# Patient Record
Sex: Male | Born: 1995 | Race: White | Hispanic: No | Marital: Single
Health system: Midwestern US, Community
[De-identification: ages and names within clinical notes are randomized; demographics above are authoritative.]

---

## 2018-06-02 ENCOUNTER — Ambulatory Visit: Attending: Family

## 2018-06-02 ENCOUNTER — Ambulatory Visit: Admit: 2018-06-02 | Discharge: 2018-06-02 | Payer: BLUE CROSS/BLUE SHIELD | Attending: Family

## 2018-06-02 DIAGNOSIS — Z7251 High risk heterosexual behavior: Secondary | ICD-10-CM

## 2018-06-02 LAB — AMB POC URINALYSIS DIP STICK AUTO W/ MICRO
Bilirubin (UA POC): NEGATIVE
Bilirubin, Urine, POC: NEGATIVE
Blood (UA POC): NEGATIVE
Blood (UA POC): NEGATIVE
Glucose (UA POC): NEGATIVE
Glucose, Urine, POC: NEGATIVE
Ketones (UA POC): NEGATIVE
Ketones, Urine, POC: NEGATIVE
Leukocyte Esterase, Urine, POC: NEGATIVE
Leukocyte esterase (UA POC): NEGATIVE
Nitrite, Urine, POC: NEGATIVE
Nitrites (UA POC): NEGATIVE
Protein (UA POC): NEGATIVE
Protein, Urine, POC: NEGATIVE
Specific Gravity, Urine, POC: 1.025 NA (ref 1.001–1.035)
Specific gravity (UA POC): 1.025 (ref 1.001–1.035)
Urobilinogen (UA POC): 0.2 (ref 0.2–1)
Urobilinogen, POC: 0.2 (ref 0.2–1)
pH (UA POC): 5.5 (ref 4.6–8.0)
pH, Urine, POC: 5.5 NA (ref 4.6–8.0)

## 2018-06-02 NOTE — Addendum Note (Signed)
Addended by: Ricki RodriguezBOUDREAU, Kechia Yahnke on: 06/05/2018 02:07 PM     Modules accepted: Orders

## 2018-06-02 NOTE — Progress Notes (Signed)
HISTORY OF PRESENT ILLNESS  Joshua Frank is a 22 y.o. male.  Exposure to STD   The history is provided by the patient. This is a new problem. The current episode started more than 2 days ago (1 week GO). The problem occurs rarely. The problem has not changed since onset.Pertinent negatives include no chest pain, no abdominal pain, no headaches and no shortness of breath. Nothing aggravates the symptoms. Nothing relieves the symptoms. He has tried nothing for the symptoms.       Review of Systems   Constitutional: Negative.    HENT: Negative.    Eyes: Negative.    Respiratory: Negative.  Negative for shortness of breath.    Cardiovascular: Negative.  Negative for chest pain.   Gastrointestinal: Negative.  Negative for abdominal pain.   Genitourinary: Negative.    Musculoskeletal: Negative.    Skin: Negative.    Neurological: Negative.  Negative for headaches.   Endo/Heme/Allergies: Negative.    Psychiatric/Behavioral: Negative.        Physical Exam   Constitutional: He is oriented to person, place, and time. Vital signs are normal. He appears well-developed and well-nourished. He is cooperative. No distress.   HENT:   Head: Normocephalic and atraumatic.   Right Ear: Hearing and external ear normal.   Left Ear: Hearing and external ear normal.   Nose: Nose normal.   Mouth/Throat: Uvula is midline and oropharynx is clear and moist. No oropharyngeal exudate, posterior oropharyngeal edema, posterior oropharyngeal erythema or tonsillar abscesses.   Eyes: Pupils are equal, round, and reactive to light. Conjunctivae and lids are normal.   Neck: Normal range of motion.   Cardiovascular: Normal rate, regular rhythm, S1 normal, S2 normal and normal heart sounds.   No murmur heard.  Pulmonary/Chest: Effort normal and breath sounds normal. No respiratory distress. He has no decreased breath sounds. He has no wheezes. He has no rhonchi. He has no rales.   Abdominal: Soft. Normal appearance and bowel sounds are normal. He  exhibits no distension. There is no hepatosplenomegaly. There is no tenderness. There is no rebound and no CVA tenderness.   Genitourinary: Penis normal. No penile tenderness.   Musculoskeletal: Normal range of motion.   Lymphadenopathy:     He has no cervical adenopathy.        Right: No inguinal adenopathy present.        Left: No inguinal adenopathy present.   Neurological: He is alert and oriented to person, place, and time. He has normal strength.   Skin: Skin is warm, dry and intact. No rash noted. He is not diaphoretic. No erythema. No pallor.   Psychiatric: He has a normal mood and affect. His speech is normal and behavior is normal. Judgment and thought content normal. Cognition and memory are normal.   Nursing note and vitals reviewed.      ASSESSMENT and PLAN  Patient presents for STD panel. He had episodes unprotected sex few months ago and last week started to feel little discomfort while urinating, he denies fever, no chills , no increase frequency, no blood, no mucus, no penile discharge, no abdominal pain, only a faint discomfort sensation genital area, no redness, no vesicles  Urine dipstick: all neg  Will do HIV, syphilis, Gn, ch and trich in urine  Will treat according to result  Discuss sex protection    ICD-10-CM ICD-9-CM    1. Unprotected sexual intercourse Z72.51 V69.2 RPR W/REFLEX TITER AND TREPONEMA ABS      HIV  1/2 AG/AB, 4TH GENERATION,W RFLX CONFIRM      CHLAMYDIA/GC PCR      TRICHOMONAS CULTURE      AMB POC URINALYSIS DIP STICK AUTO W/ MICRO   2. Dysuria R30.0 788.1      Visit Vitals  BP 129/71 (BP 1 Location: Left arm, BP Patient Position: Sitting)   Pulse 63   Temp 98.6 ??F (37 ??C) (Oral)   Resp 17   Ht 6\' 3"  (1.905 m)   Wt 230 lb (104.3 kg)   SpO2 97%   BMI 28.75 kg/m??           *Side effects, adverse effects, risks versus benefits associated with medications prescribed/recommended were discussed with the patient. Patient verbalized understanding. All questions answered.       * I have reviewed the patient's medication list, past medical, family, social, and surgical    history and updated the patient record appropriately      Social History     Socioeconomic History   ??? Marital status: SINGLE     Spouse name: Not on file   ??? Number of children: Not on file   ??? Years of education: Not on file   ??? Highest education level: Not on file   Occupational History   ??? Not on file   Social Needs   ??? Financial resource strain: Not on file   ??? Food insecurity:     Worry: Not on file     Inability: Not on file   ??? Transportation needs:     Medical: Not on file     Non-medical: Not on file   Tobacco Use   ??? Smoking status: Never Smoker   ??? Smokeless tobacco: Never Used   Substance and Sexual Activity   ??? Alcohol use: Not on file   ??? Drug use: Not on file   ??? Sexual activity: Not on file   Lifestyle   ??? Physical activity:     Days per week: Not on file     Minutes per session: Not on file   ??? Stress: Not on file   Relationships   ??? Social connections:     Talks on phone: Not on file     Gets together: Not on file     Attends religious service: Not on file     Active member of club or organization: Not on file     Attends meetings of clubs or organizations: Not on file     Relationship status: Not on file   ??? Intimate partner violence:     Fear of current or ex partner: Not on file     Emotionally abused: Not on file     Physically abused: Not on file     Forced sexual activity: Not on file   Other Topics Concern   ??? Not on file   Social History Narrative   ??? Not on file     History reviewed. No pertinent past medical history.  History reviewed. No pertinent surgical history.  History reviewed. No pertinent family history.

## 2018-06-02 NOTE — Patient Instructions (Signed)
Safer Sex: Care Instructions  Your Care Instructions  Safer sex is a way to reduce your risk of getting an infection spread through sex. It can also help prevent pregnancy. Most infections that are spread through sex, also called sexually transmitted infections or STIs, can be cured. But some can decrease your chances of getting pregnant if they are not treated early. Others, such as herpes, have no cure. And some, such as HIV, can be deadly.  Several products can help you practice safer sex and reduce your chance of STIs. One of the best is a condom. There are condoms for men and for women. The male condom is a tube of soft plastic with a closed end that is placed deep into the vagina. You can use a special rubber sheet (dental dam) for protection during oral sex. Disposable gloves can keep your hands from touching blood, semen, or other body fluids that can carry infections.  Remember that birth control methods such as diaphragms, IUDs, foams, and birth control pills do not stop you from getting STIs.  Follow-up care is a key part of your treatment and safety. Be sure to make and go to all appointments, and call your doctor if you are having problems. It's also a good idea to know your test results and keep a list of the medicines you take.  How can you care for yourself at home?  ?? Think about getting shots to prevent hepatitis A and hepatitis B. These two diseases can be spread through sex. You also can get hepatitis A if you eat infected food.  ?? Use condoms or male condoms each time and every time you have sex.  ?? Learn the right way to use a male condom:  ? Condoms come in several sizes. Make sure you use the right size. A condom that is too small can break easily. A condom that is too big can slip off during sex. Use a new condom each time you have sex.  ? Be careful not to poke a hole in the condom when you open the wrapper.  ? Squeeze the tip of the condom to keep out air.   ? Pull down the loose skin (foreskin) from the head of an uncircumcised penis.  ? While squeezing the tip of the condom, unroll it all the way down to the base of the firm penis.  ? Never use petroleum jelly (such as Vaseline), grease, hand lotion, baby oil, or anything with oil in it. These products can make holes in the condom.  ? After sex, hold the condom on your penis as you remove your penis from your partner. This will keep semen from spilling out of the condom.  ?? Learn to use a male condom:  ? You can put in a male condom up to 8 hours before sex.  ? Squeeze the smaller ring at the closed end and insert it deep into the vagina. The larger ring at the open end should stay outside the vagina.  ? During sex, make sure the penis goes into the condom.  ? After the penis is removed, close the open end of the condom by twisting it. Remove the condom.  ?? Do not use a male condom and male condom at the same time.  ?? Do not have sex with anyone who has symptoms of an STI, such as sores on the genitals or mouth. The herpes virus that causes cold sores can spread to and from the penis and   vagina.  ?? Do not drink a lot of alcohol or use drugs before sex. This can cause you to let down your guard and not practice safer sex.  ?? Having one sex partner (who does not have STIs and does not have sex with anyone else) is a sure way to avoid STIs.  ?? Talk to your partner before you have sex. Find out if he or she has or is at risk for any STI. Keep in mind that a person may be able to spread an STI even if he or she does not have symptoms. You and your partner may want to get an HIV test. You should get tested again 6 months later.  Where can you learn more?  Go to http://www.healthwise.net/[employer].  Enter B608 in the search box to learn more about "Safer Sex: Care Instructions."  Current as of: May 06, 2017  Content Version: 12.2  ?? 2006-2019 [employer]. Care instructions adapted under  license by [employer] (which disclaims liability or warranty for this information). If you have questions about a medical condition or this instruction, always ask your healthcare professional. [employer] disclaims any warranty or liability for your use of this information.

## 2018-06-02 NOTE — Addendum Note (Signed)
Addendum Note by Ricki Rodriguez, NP at 06/02/18 1100                Author: Ricki Rodriguez, NP  Service: --  Author Type: Nurse Practitioner       Filed: 06/05/18 1407  Encounter Date: 06/02/2018  Status: Signed          Editor: Ricki Rodriguez, NP (Nurse Practitioner)          Addended by: Ricki Rodriguez on: 06/05/2018 02:07 PM    Modules accepted: Orders

## 2018-06-02 NOTE — Progress Notes (Signed)
HISTORY OF PRESENT ILLNESS  Joshua Frank is a 22 y.o. male.  Exposure to STD   The history is provided by the patient. This is a new problem. The current episode started more than 2 days ago (1 week GO). The problem occurs rarely. The problem has not changed since onset.Pertinent negatives include no chest pain, no abdominal pain, no headaches and no shortness of breath. Nothing aggravates the symptoms. Nothing relieves the symptoms. He has tried nothing for the symptoms.       Review of Systems   Constitutional: Negative.    HENT: Negative.    Eyes: Negative.    Respiratory: Negative.  Negative for shortness of breath.    Cardiovascular: Negative.  Negative for chest pain.   Gastrointestinal: Negative.  Negative for abdominal pain.   Genitourinary: Negative.    Musculoskeletal: Negative.    Skin: Negative.    Neurological: Negative.  Negative for headaches.   Endo/Heme/Allergies: Negative.    Psychiatric/Behavioral: Negative.        Physical Exam   Constitutional: He is oriented to person, place, and time. Vital signs are normal. He appears well-developed and well-nourished. He is cooperative. No distress.   HENT:   Head: Normocephalic and atraumatic.   Right Ear: Hearing and external ear normal.   Left Ear: Hearing and external ear normal.   Nose: Nose normal.   Mouth/Throat: Uvula is midline and oropharynx is clear and moist. No oropharyngeal exudate, posterior oropharyngeal edema, posterior oropharyngeal erythema or tonsillar abscesses.   Eyes: Pupils are equal, round, and reactive to light. Conjunctivae and lids are normal.   Neck: Normal range of motion.   Cardiovascular: Normal rate, regular rhythm, S1 normal, S2 normal and normal heart sounds.   No murmur heard.  Pulmonary/Chest: Effort normal and breath sounds normal. No respiratory distress. He has no decreased breath sounds. He has no wheezes. He has no rhonchi. He has no rales.   Abdominal: Soft. Normal appearance and bowel sounds are normal. He  exhibits no distension. There is no hepatosplenomegaly. There is no tenderness. There is no rebound and no CVA tenderness.   Genitourinary: Penis normal. No penile tenderness.   Musculoskeletal: Normal range of motion.   Lymphadenopathy:     He has no cervical adenopathy.        Right: No inguinal adenopathy present.        Left: No inguinal adenopathy present.   Neurological: He is alert and oriented to person, place, and time. He has normal strength.   Skin: Skin is warm, dry and intact. No rash noted. He is not diaphoretic. No erythema. No pallor.   Psychiatric: He has a normal mood and affect. His speech is normal and behavior is normal. Judgment and thought content normal. Cognition and memory are normal.   Nursing note and vitals reviewed.      ASSESSMENT and PLAN  Patient presents for STD panel. He had episodes unprotected sex few months ago and last week started to feel little discomfort while urinating, he denies fever, no chills , no increase frequency, no blood, no mucus, no penile discharge, no abdominal pain, only a faint discomfort sensation genital area, no redness, no vesicles  Urine dipstick: all neg  Will do HIV, syphilis, Gn, ch and trich in urine  Will treat according to result  Discuss sex protection    ICD-10-CM ICD-9-CM    1. Unprotected sexual intercourse Z72.51 V69.2 RPR W/REFLEX TITER AND TREPONEMA ABS      HIV  1/2 AG/AB, 4TH GENERATION,W RFLX CONFIRM      CHLAMYDIA/GC PCR      TRICHOMONAS CULTURE      AMB POC URINALYSIS DIP STICK AUTO W/ MICRO   2. Dysuria R30.0 788.1      Visit Vitals  BP 129/71 (BP 1 Location: Left arm, BP Patient Position: Sitting)   Pulse 63   Temp 98.6 ??F (37 ??C) (Oral)   Resp 17   Ht 6\' 3"  (1.905 m)   Wt 230 lb (104.3 kg)   SpO2 97%   BMI 28.75 kg/m??           *Side effects, adverse effects, risks versus benefits associated with medications prescribed/recommended were discussed with the patient. Patient verbalized understanding. All questions answered.      * I have  reviewed the patient's medication list, past medical, family, social, and surgical    history and updated the patient record appropriately      Social History     Socioeconomic History   ??? Marital status: SINGLE     Spouse name: Not on file   ??? Number of children: Not on file   ??? Years of education: Not on file   ??? Highest education level: Not on file   Occupational History   ??? Not on file   Social Needs   ??? Financial resource strain: Not on file   ??? Food insecurity:     Worry: Not on file     Inability: Not on file   ??? Transportation needs:     Medical: Not on file     Non-medical: Not on file   Tobacco Use   ??? Smoking status: Never Smoker   ??? Smokeless tobacco: Never Used   Substance and Sexual Activity   ??? Alcohol use: Not on file   ??? Drug use: Not on file   ??? Sexual activity: Not on file   Lifestyle   ??? Physical activity:     Days per week: Not on file     Minutes per session: Not on file   ??? Stress: Not on file   Relationships   ??? Social connections:     Talks on phone: Not on file     Gets together: Not on file     Attends religious service: Not on file     Active member of club or organization: Not on file     Attends meetings of clubs or organizations: Not on file     Relationship status: Not on file   ??? Intimate partner violence:     Fear of current or ex partner: Not on file     Emotionally abused: Not on file     Physically abused: Not on file     Forced sexual activity: Not on file   Other Topics Concern   ??? Not on file   Social History Narrative   ??? Not on file     History reviewed. No pertinent past medical history.  History reviewed. No pertinent surgical history.  History reviewed. No pertinent family history.

## 2018-06-03 LAB — CHLAMYDIA/GC PCR
CHLAMYDIA TRACHOMATIS, NAA, 188078: POSITIVE — AB
Chlamydia trachomatis, NAA: POSITIVE — AB
NEISSERIA GONORRHOEAE, NAA, 188086: NEGATIVE
Neisseria gonorrhoeae, NAA: NEGATIVE

## 2018-06-04 LAB — RPR W/REFLEX TITER AND TREPONEMA ABS
RPR: NONREACTIVE
RPR: NONREACTIVE

## 2018-06-04 LAB — HIV 1/2 AG/AB, 4TH GENERATION,W RFLX CONFIRM: HIV SCREEN 4TH GENERATION WRFX: NONREACTIVE

## 2018-06-04 LAB — TRICHOMONAS CULTURE

## 2018-06-04 LAB — HIV 1/2 ANTIGEN/ANTIBODY, FOURTH GENERATION W/RFL: HIV Screen 4th Generation wRfx: NONREACTIVE

## 2018-06-05 MED ORDER — AZITHROMYCIN 500 MG TAB
500 mg | ORAL_TABLET | ORAL | 0 refills | Status: AC
Start: 2018-06-05 — End: ?

## 2018-07-04 NOTE — Telephone Encounter (Signed)
Discussed results of STD positive for chlamydia, discussed the disease and why import to get it treated will treat with azithromycin 1g today  Come back in 6 weeks for repeat test or sonner if symptoms does not improve  Tell partner will need to get tested and treated  Pt agrees and verbalize understanding

## 2020-03-27 ENCOUNTER — Ambulatory Visit (HOSPITAL_COMMUNITY)
Admission: RE | Admit: 2020-03-27 | Discharge: 2020-03-27 | Disposition: A | Discharge status: Home / Self Care | Payer: 59 | Attending: Psychiatry | Admitting: Psychiatry

## 2020-03-27 NOTE — H&P (Addendum)
Behavioral Health Medical Screening Exam  Jose Conley is an 24 y.o. male.who presented to [facility] as a walk-in, voluntarily. Patient endorsed depression and anxiety. He stated that he was advised to come to [facility] by friends who had noticed his change in mood. He stated," a lot of things have been building up lately that has effected my mental health." He stated," last Friday I was so anxious after having a issue with my jaw that I became paranoid."  He denied current suicidal and homicidal thoughts. He denied current psychosis. He reported he has had off and on suicidal thoughts for the past 3 months although denied recently that he has had plan or intent to harm himself. He denied prior suicide attempts but did state in October of last year, following a fall ut with his girlfriend, he held a knife and had thoughts of cutting himself. He denied history of NSSIB. Denied prior psychiatric history. Denied having any outpatient psychiatric services. He reported daily use of mariajuana and occasional acholia use. He denied other substance abuse or use. Denied access to firearms. Denied legal issues. Denied history of aggression or assaultive  behaviors.  Denied trauma related history. Stated he was hoping to get resources for intensive outpatient psychiatric services.   Total Time spent with patient: 30 minutes  Psychiatric Specialty Exam: Physical Exam Psychiatric:        Behavior: Behavior normal.        Thought Content: Thought content normal.        Judgment: Judgment normal.     Comments: Anxious and depressed mood     Review of Systems  Psychiatric/Behavioral: The patient is nervous/anxious.    There were no vitals taken for this visit.There is no height or weight on file to calculate BMI. General Appearance: Fairly Groomed Eye Contact:  Fair Speech:  Clear and Coherent and Normal Rate Volume:  Normal Mood:  Anxious and Depressed Affect:  Congruent Thought Process:  Coherent, Linear and  Descriptions of Associations: Intact Orientation:  Full (Time, Place, and Person) Thought Content:  Logical Suicidal Thoughts:  No Homicidal Thoughts:  No Memory:  Immediate;   Fair Recent;   Fair Judgement:  Fair Insight:  Fair Psychomotor Activity:  Normal Concentration: Concentration: Fair and Attention Span: Fair Recall:  [employer] of Knowledge:Fair Language: Good Akathisia:  Negative Handed:  Right AIMS (if indicated):    Assets:  Communication Skills Desire for Improvement Resilience Sleep:     Musculoskeletal: Strength & Muscle Tone: within normal limits Gait & Station: normal Patient leans: N/A  There were no vitals taken for this visit.  Recommendations: Based on my evaluation the patient does not appear to have an emergency medical condition.   No evidence of imminent risk to self or others at present.   Patient does not meet criteria for psychiatric inpatient admission. Resources provided for outpatient therapy. Patient encouraged to follow-up with resources.  Patient determined to be mentally safe and stable to leave the hospital.      Denzil Magnuson, NP 03/27/2020, 1:46 PM

## 2021-04-16 ENCOUNTER — Encounter (HOSPITAL_BASED_OUTPATIENT_CLINIC_OR_DEPARTMENT_OTHER): Payer: Self-pay

## 2021-04-16 ENCOUNTER — Emergency Department (HOSPITAL_BASED_OUTPATIENT_CLINIC_OR_DEPARTMENT_OTHER)
Admission: EM | Admit: 2021-04-16 | Discharge: 2021-04-17 | Disposition: A | Discharge status: Home / Self Care | Payer: 59 | Attending: Emergency Medicine | Admitting: Emergency Medicine

## 2021-04-16 ENCOUNTER — Emergency Department (HOSPITAL_BASED_OUTPATIENT_CLINIC_OR_DEPARTMENT_OTHER): Payer: 59

## 2021-04-16 ENCOUNTER — Other Ambulatory Visit: Payer: Self-pay

## 2021-04-16 DIAGNOSIS — F17210 Nicotine dependence, cigarettes, uncomplicated: Secondary | ICD-10-CM | POA: Diagnosis not present

## 2021-04-16 DIAGNOSIS — S8011XA Contusion of right lower leg, initial encounter: Secondary | ICD-10-CM | POA: Diagnosis not present

## 2021-04-16 DIAGNOSIS — Y9389 Activity, other specified: Secondary | ICD-10-CM | POA: Insufficient documentation

## 2021-04-16 DIAGNOSIS — W1849XA Other slipping, tripping and stumbling without falling, initial encounter: Secondary | ICD-10-CM | POA: Diagnosis not present

## 2021-04-16 DIAGNOSIS — S8991XA Unspecified injury of right lower leg, initial encounter: Secondary | ICD-10-CM | POA: Diagnosis present

## 2021-04-16 MED ORDER — OXYCODONE-ACETAMINOPHEN 5-325 MG PO TABS
1.0000 | ORAL_TABLET | ORAL | Status: DC | PRN
  Administered 2021-04-16: 1 via ORAL
  Filled 2021-04-16: qty 1

## 2021-04-16 NOTE — ED Triage Notes (Signed)
Was playing kickball and slid into base and now has right knee pain and swelling.

## 2021-04-17 NOTE — Discharge Instructions (Addendum)
Use crutches for at least a week.  Do not put any weight on the right leg.  Keep it elevated above your heart.  You can place ice on the injured area frequently. If you have any new weakness, numbness or discoloration of your right foot in the next 48 to 72 hours please return to the ER

## 2021-04-17 NOTE — ED Provider Notes (Signed)
[facility] EMERGENCY DEPT Provider Note   CSN: [id number] Arrival date & time: 04/16/21  2036     History Chief Complaint  Patient presents with   Leg Injury    Jose Conley is a 25 y.o. male.  The history is provided by the patient.  Leg Pain Location:  Leg Injury: yes   Leg location:  R lower leg Pain details:    Quality:  Aching   Radiates to:  Does not radiate   Severity:  Severe   Onset quality:  Sudden   Duration:  4 hours   Timing:  Constant   Progression:  Improving Chronicity:  New Relieved by:  Immobilization Worsened by:  Activity Associated symptoms: swelling   Associated symptoms: no muscle weakness   Patient presents for right leg injury.  Patient was playing kickball and slid into a base and injured his right leg.  No other injuries reported.  He reports it hurts to walk.     PMH-none Social History   Tobacco Use   Smoking status: Every Day    Types: Cigarettes   Smokeless tobacco: Never  Vaping Use   Vaping Use: Never used  Substance Use Topics   Alcohol use: Yes   Drug use: Never    Home Medications Prior to Admission medications   Not on File    Allergies    Patient has no known allergies.  Review of Systems   Review of Systems  Musculoskeletal:  Positive for arthralgias.  Neurological:  Negative for weakness.   Physical Exam Updated Vital Signs BP 136/88 (BP Location: Right Arm)   Pulse 71   Temp 98.3 F (36.8 C) (Oral)   Resp 15   Ht 1.905 m (6\' 3" )   Wt 108.4 kg   SpO2 100%   BMI 29.87 kg/m   Physical Exam CONSTITUTIONAL: Well developed/well nourished HEAD: Normocephalic/atraumatic EYES: EOMI ENMT: Mucous membranes moist NECK: supple no meningeal signs NEURO: Pt is awake/alert/appropriate, moves all extremitiesx4.  No facial droop.   EXTREMITIES: pulses normal/equal, full ROM Tenderness and abrasion noted to right proximal tibial surface with mild swelling.  No lacerations.  Patient is able to  flex and extend the right knee without difficulty.  There is no right ankle or foot tenderness.  Distal pulses are equal intact.  No calf tenderness noted SKIN: warm, color normal PSYCH: no abnormalities of mood noted, alert and oriented to situation  ED Results / Procedures / Treatments   Labs (all labs ordered are listed, but only abnormal results are displayed) Labs Reviewed - No data to display  EKG None  Radiology DG Tibia/Fibula Right  Result Date: 04/16/2021 CLINICAL DATA:  Injury while playing kickball. Pain on the anterior proximal tib/fib. Slight swelling. EXAM: RIGHT TIBIA AND FIBULA - 2 VIEW COMPARISON:  None. FINDINGS: No evidence of acute fracture or dislocation. No focal bone lesion or bone destruction. Prominent vascular structures in the soft tissues likely representing venous varicosities. Scattered soft tissue calcification may represent dystrophic calcification or possibly vascular calcification. IMPRESSION: No acute bony abnormalities. Electronically Signed   By: 04/18/2021 M.D.   On: 04/16/2021 21:26    Procedures Procedures   Medications Ordered in ED Medications  oxyCODONE-acetaminophen (PERCOCET/ROXICET) 5-325 MG per tablet 1 tablet (1 tablet Oral Given 04/16/21 2055)    ED Course  I have reviewed the triage vital signs and the nursing notes.  Pertinent  imaging results that were available during my care of the patient were reviewed by  me and considered in my medical decision making (see chart for details).    MDM Rules/Calculators/A&P                           I personally reviewed the x-ray there is no acute fracture.  Patient reports some improvement while waiting in the ER. Suspect contusion.  Plan to use crutches, elevate, ice frequently. If no improvement next week he needs to follow-up with orthopedics We discussed strict  ER return precautions Final Clinical Impression(s) / ED Diagnoses Final diagnoses:  Contusion of right lower  extremity, initial encounter    Rx / DC Orders ED Discharge Orders     None        Zadie Rhine, MD 04/17/21 [phone]

## 2021-09-28 ENCOUNTER — Other Ambulatory Visit: Payer: Self-pay | Admitting: Internal Medicine

## 2021-09-28 DIAGNOSIS — E069 Thyroiditis, unspecified: Secondary | ICD-10-CM

## 2021-10-16 ENCOUNTER — Ambulatory Visit
Admission: RE | Admit: 2021-10-16 | Discharge: 2021-10-16 | Disposition: A | Discharge status: Home / Self Care | Payer: 59 | Source: Ambulatory Visit | Attending: Internal Medicine | Admitting: Internal Medicine

## 2021-10-16 DIAGNOSIS — E069 Thyroiditis, unspecified: Secondary | ICD-10-CM

## 2022-12-12 IMAGING — US US THYROID
1 series · 14 of 25 positions shown · non-contrast
Comparison: None.

CLINICAL DATA: Goiter. Thyroiditis, possible thyroid gland
calcifications seen on outside imaging.

EXAM:
THYROID ULTRASOUND
TECHNIQUE: Ultrasound examination of the thyroid gland and adjacent soft
tissues was performed.

[Series 1: us thyroid · 0.07mm/px · 14 of 35 slices shown]
[im 1/35]
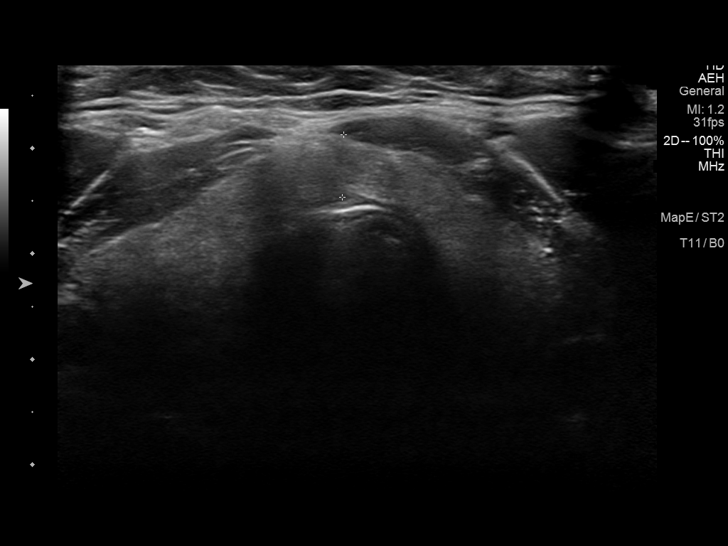
[im 3/35]
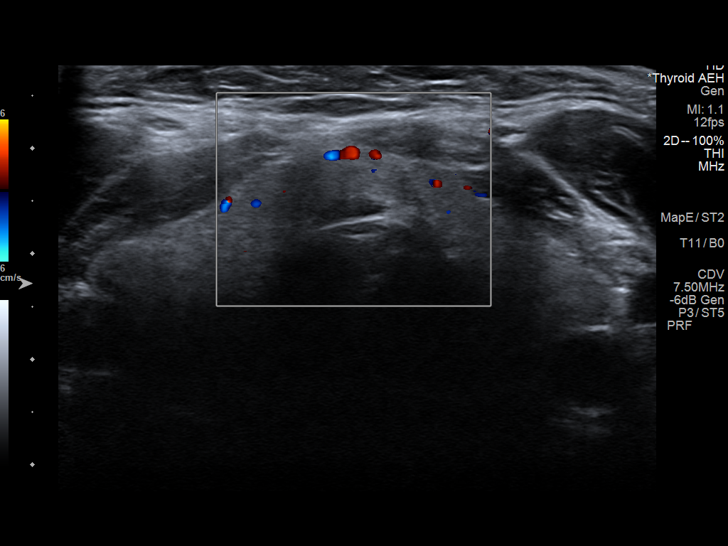
[im 6/35]
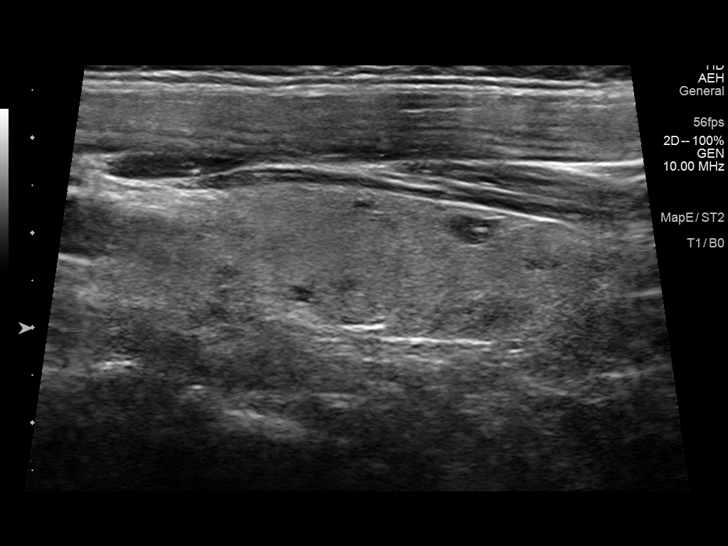
[im 9/35]
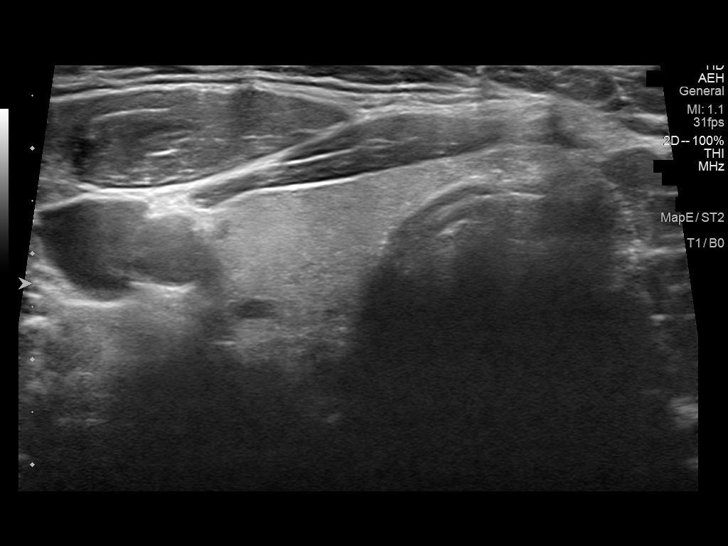
[im 12/35]
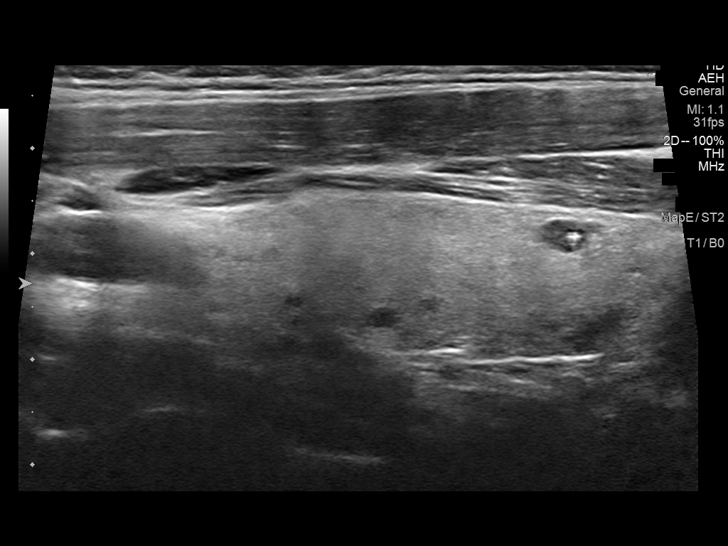
[im 13/35]
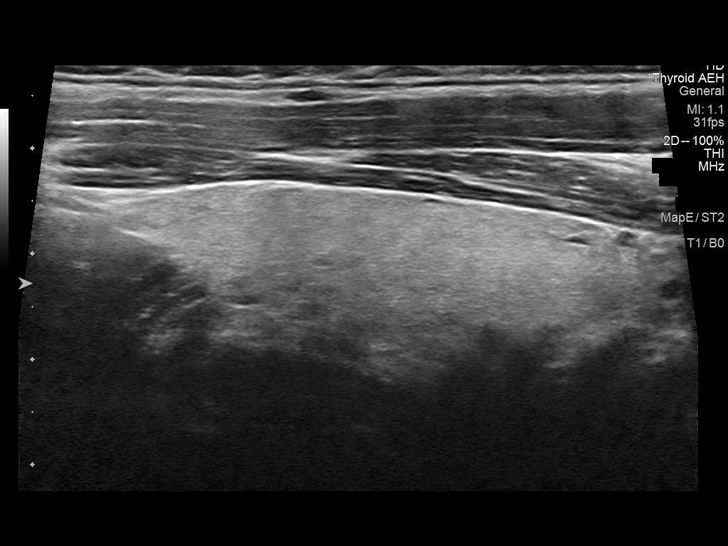
[im 16/35]
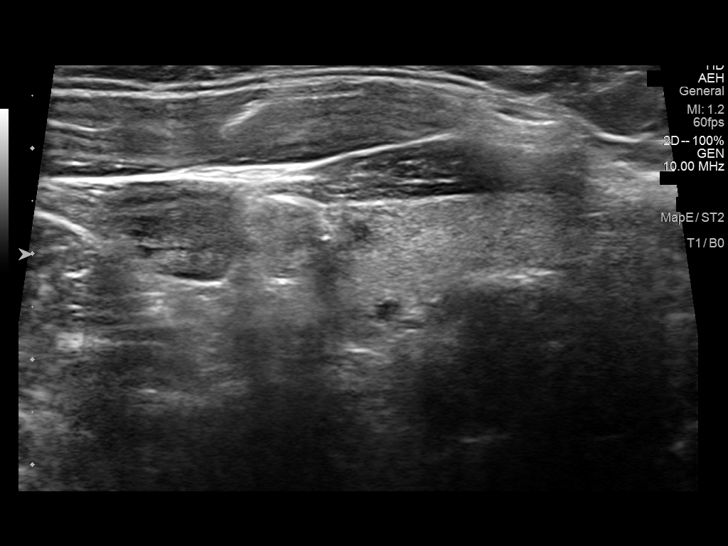
[im 19/35]
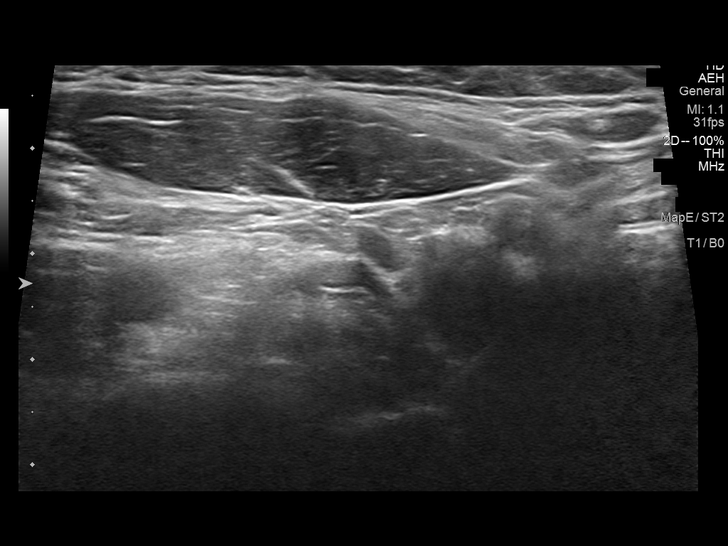
[im 22/35]
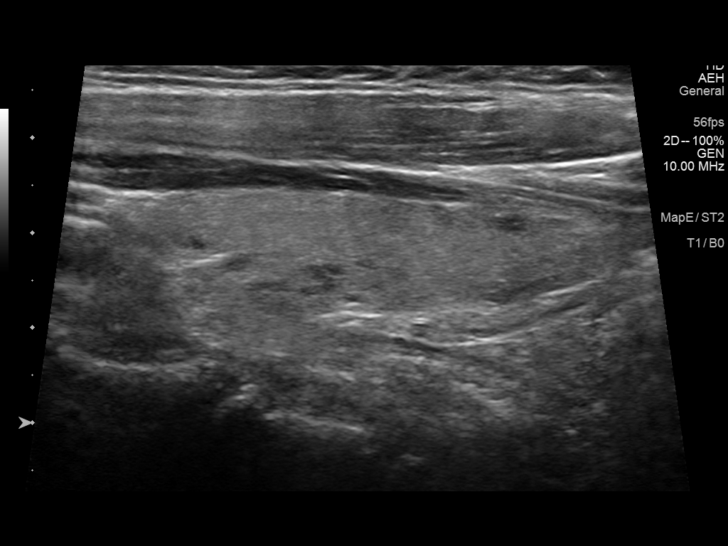
[im 23/35]
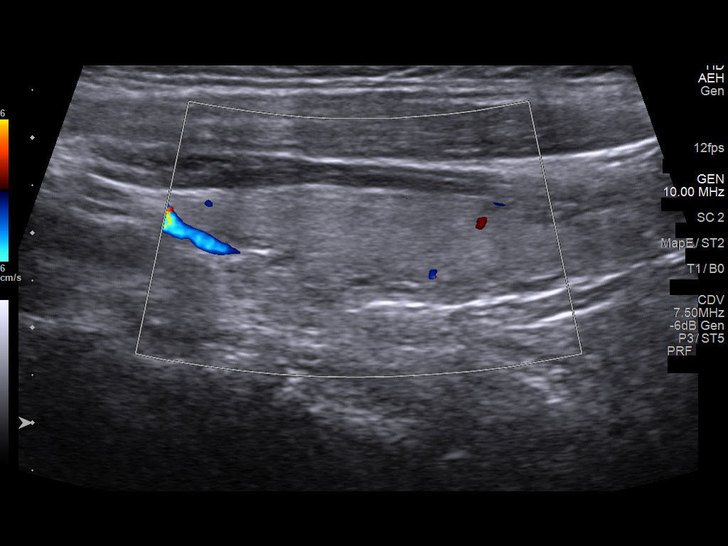
[im 26/35]
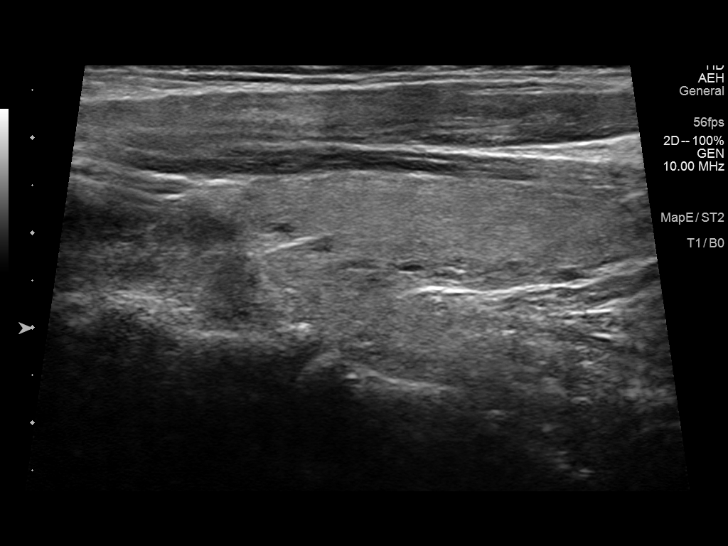
[im 29/35]
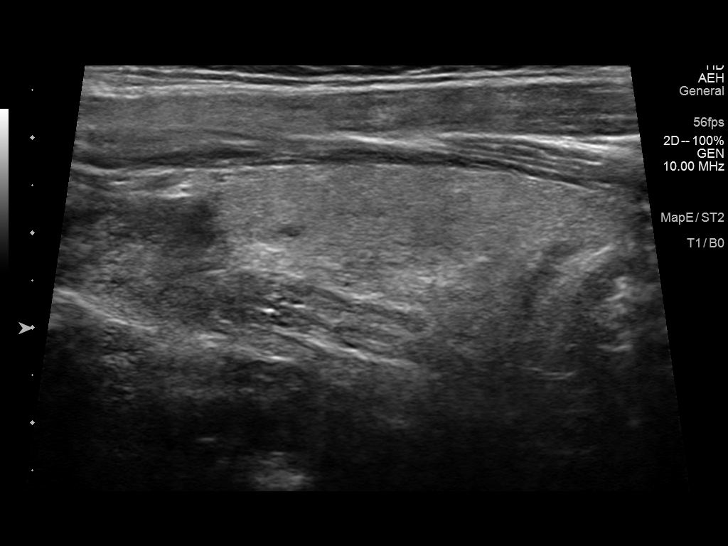
[im 32/35]
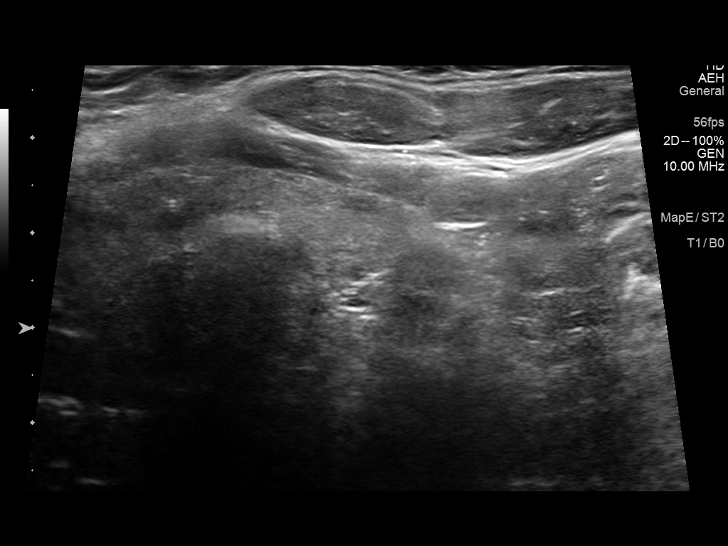
[im 35/35]
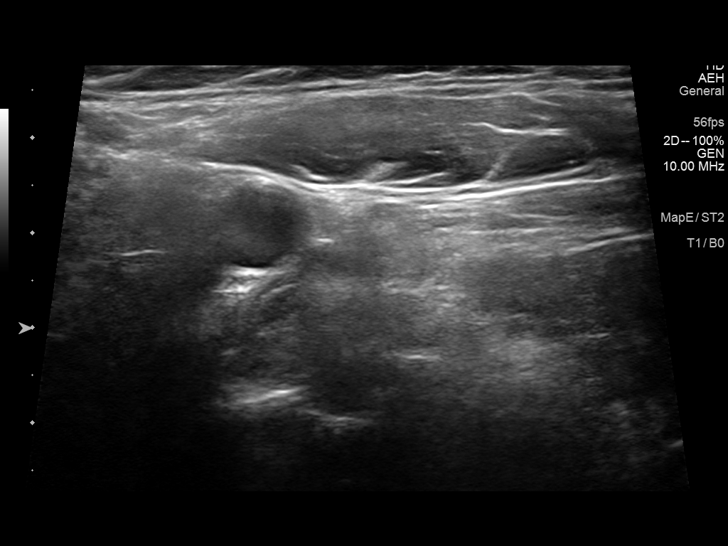

[14 of 25 positions shown; findings below may reference images not displayed]

FINDINGS: Parenchymal Echotexture: Mildly heterogenous

Isthmus: 0.6 cm

Right lobe: 5.3 x 1.4 x 1.7 cm

Left lobe: 5.4 x 1.8 x 1.7 cm

_________________________________________________________

Estimated total number of nodules >/= 1 cm: 0

Number of spongiform nodules >/=  2 cm not described below (TR1): 0

Number of mixed cystic and solid nodules >/= 1.5 cm not described
below (TR2): 0

_________________________________________________________

No discrete nodules are seen within the thyroid gland.
IMPRESSION: Mildly heterogeneous and enlarged thyroid gland consistent with the
clinical history of thyroiditis.

No discrete thyroid nodules are identified.

The above is in keeping with the ACR TI-RADS recommendations - [HOSPITAL] 9765;[DATE].
